# Patient Record
Sex: Male | Born: 1950 | Hispanic: No | Marital: Married
Health system: Southern US, Community
[De-identification: ages and names within clinical notes are randomized; demographics above are authoritative.]

---

## 2015-01-04 ENCOUNTER — Institutional Professional Consult (permissible substitution) (HOSPITAL_COMMUNITY): Payer: Self-pay

## 2015-01-04 ENCOUNTER — Inpatient Hospital Stay
Admission: AD | Admit: 2015-01-04 | Discharge: 2015-02-07 | Disposition: E | Payer: Self-pay | Source: Ambulatory Visit | Attending: Internal Medicine | Admitting: Internal Medicine

## 2015-01-04 DIAGNOSIS — Z4659 Encounter for fitting and adjustment of other gastrointestinal appliance and device: Secondary | ICD-10-CM

## 2015-01-04 DIAGNOSIS — T17408A Unspecified foreign body in trachea causing other injury, initial encounter: Secondary | ICD-10-CM

## 2015-01-05 DIAGNOSIS — J810 Acute pulmonary edema: Secondary | ICD-10-CM

## 2015-01-05 DIAGNOSIS — J90 Pleural effusion, not elsewhere classified: Secondary | ICD-10-CM

## 2015-01-05 DIAGNOSIS — J9601 Acute respiratory failure with hypoxia: Secondary | ICD-10-CM

## 2015-01-05 DIAGNOSIS — J189 Pneumonia, unspecified organism: Secondary | ICD-10-CM

## 2015-01-05 LAB — COMPREHENSIVE METABOLIC PANEL
ALT: 33 U/L (ref 0–53)
ANION GAP: 10 (ref 5–15)
AST: 26 U/L (ref 0–37)
Albumin: 2.6 g/dL — ABNORMAL LOW (ref 3.5–5.2)
Alkaline Phosphatase: 56 U/L (ref 39–117)
BUN: 39 mg/dL — ABNORMAL HIGH (ref 6–23)
CO2: 24 mmol/L (ref 19–32)
Calcium: 8.6 mg/dL (ref 8.4–10.5)
Chloride: 112 mmol/L (ref 96–112)
Creatinine, Ser: 1.93 mg/dL — ABNORMAL HIGH (ref 0.50–1.35)
GFR calc Af Amer: 40 mL/min — ABNORMAL LOW (ref >90)
GFR calc non Af Amer: 35 mL/min — ABNORMAL LOW (ref >90)
GLUCOSE: 107 mg/dL — AB (ref 70–99)
POTASSIUM: 3.6 mmol/L (ref 3.5–5.1)
Sodium: 146 mmol/L — ABNORMAL HIGH (ref 135–145)
TOTAL PROTEIN: 5.6 g/dL — AB (ref 6.0–8.3)
Total Bilirubin: 0.8 mg/dL (ref 0.3–1.2)

## 2015-01-05 LAB — CBC WITH DIFFERENTIAL/PLATELET
Basophils Absolute: 0 10*3/uL (ref 0.0–0.1)
Basophils Relative: 0 % (ref 0–1)
EOS ABS: 0 10*3/uL (ref 0.0–0.7)
Eosinophils Relative: 0 % (ref 0–5)
HEMATOCRIT: 35 % — AB (ref 39.0–52.0)
Hemoglobin: 10.7 g/dL — ABNORMAL LOW (ref 13.0–17.0)
Lymphocytes Relative: 2 % — ABNORMAL LOW (ref 12–46)
Lymphs Abs: 0.3 10*3/uL — ABNORMAL LOW (ref 0.7–4.0)
MCH: 25.7 pg — ABNORMAL LOW (ref 26.0–34.0)
MCHC: 30.6 g/dL (ref 30.0–36.0)
MCV: 84.1 fL (ref 78.0–100.0)
MONO ABS: 1.4 10*3/uL — AB (ref 0.1–1.0)
MONOS PCT: 11 % (ref 3–12)
Neutro Abs: 11.6 10*3/uL — ABNORMAL HIGH (ref 1.7–7.7)
Neutrophils Relative %: 87 % — ABNORMAL HIGH (ref 43–77)
PLATELETS: 265 10*3/uL (ref 150–400)
RBC: 4.16 MIL/uL — ABNORMAL LOW (ref 4.22–5.81)
RDW: 18.7 % — ABNORMAL HIGH (ref 11.5–15.5)
WBC: 13.2 10*3/uL — AB (ref 4.0–10.5)

## 2015-01-05 LAB — VITAMIN B12: Vitamin B-12: 1206 pg/mL — ABNORMAL HIGH (ref 211–911)

## 2015-01-05 LAB — TSH: TSH: 12.64 u[IU]/mL — ABNORMAL HIGH (ref 0.350–4.500)

## 2015-01-05 LAB — PHOSPHORUS: PHOSPHORUS: 4.6 mg/dL (ref 2.3–4.6)

## 2015-01-05 LAB — LACTIC ACID, PLASMA: LACTIC ACID, VENOUS: 4 mmol/L — AB (ref 0.5–2.0)

## 2015-01-05 LAB — URIC ACID: URIC ACID, SERUM: 9.5 mg/dL — AB (ref 4.0–7.8)

## 2015-01-05 LAB — MAGNESIUM: Magnesium: 2.3 mg/dL (ref 1.5–2.5)

## 2015-01-05 LAB — VANCOMYCIN, TROUGH: VANCOMYCIN TR: 27.4 ug/mL — AB (ref 10.0–20.0)

## 2015-01-05 LAB — APTT: APTT: 30 s (ref 24–37)

## 2015-01-05 LAB — PROCALCITONIN: PROCALCITONIN: 0.15 ng/mL

## 2015-01-05 NOTE — Consult Note (Signed)
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 Transferred to [facility] from [place] on 4/28 for ventilator weaning after a prolonged hospital course. Initially admitted w/ working dx of CAP and decompensated HF (EF 25-30%). Course was c/b VT (treated w/ amio), acute on chronic renal failure (likey driven by diuretics w/ BL src ~1.7), acute encephalopathy and septic shock. Ultimately transferred to [facility] for weaning efforts.   SIGNIFICANT EVENTS  Cardiac cath at [facility]: neg for sig CAD, elevated left ventricular diastolic pressure (31 mmHg) Seen by nephrology for ARF at [facility] Seen by rheum for sarcoid at [facility] started on pred.   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   Prior to Admission medications   Reviewed    Allergies not on file No known drug allergies   FAMILY HISTORY:  + DM, heart failure HTN SOCIAL HISTORY: Retired, heavy smoker, lived alone. Drank beer socially   REVIEW OF SYSTEMS:   Unable   SUBJECTIVE:  Unable  VITAL SIGNS:  reviewed. AF w/ RVR on tele Normotensive   PHYSICAL EXAMINATION: General:  Currently lying in the bed. Agitated but not focal  Neuro:  Awake, agitated, moves all ext. Does not f/c  HEENT:  Orally intubated, no JVd  Cardiovascular:  Tachy irreg  Lungs:  Diffuse rales/course rhonchi  Abdomen:  Soft + bowel sounds Musculoskeletal:  Intact  Skin:  + LE edema 3+   Recent Labs Lab 01/05/15 0905  NA 146*  K 3.6  CL 112  CO2 24  BUN 39*  CREATININE  1.93*  GLUCOSE 107*    Recent Labs Lab 01/05/15 0905  HGB 10.7*  HCT 35.0*  WBC 13.2*  PLT 265   Dg Chest Port 1 View  Jan 11, 2015   CLINICAL DATA:  64 year old male status post intubation  EXAM: PORTABLE CHEST - 1 VIEW  COMPARISON:  Concurrently obtained radiographs of the abdomen  FINDINGS: Endotracheal tube tip is 4.6 cm above the carina. A nasogastric tube is present. The tip lies below the field of view likely within the stomach. Right upper extremity PICC. Catheter tip projects over the superior cavoatrial junction. Cardiomegaly. Bilateral layering pleural effusions with associated bibasilar opacities (asymmetric and greater on the right than the left). Pulmonary vascular congestion and mild interstitial edema.  IMPRESSION: 1. The tip of the endotracheal tube is 4.6 cm above the carina. 2. The tip of the right upper extremity PICC projects over the superior cavoatrial junction. 3. Marked enlargement of the cardiopericardial silhouette which may reflect cardiomegaly and/or pericardial effusion. 4. Bilateral layering pleural effusions. 5. Bibasilar opacities more conspicuous on the right than the left. Opacities may reflect a combination of pleural fluid with atelectasis. However, superimposed infiltrate is difficult to exclude particularly in the right lower lobe.   Electronically Signed   By: Malachy Moan M.D.   On: 01-11-2015 20:13   Dg Abd Portable 1v  01-11-15   CLINICAL DATA:  NG tube placement  EXAM: PORTABLE ABDOMEN - 1  VIEW  COMPARISON:  None.  FINDINGS: Nasogastric tube with tip in the gastric body. Left basilar atelectasis. Normal bowel-gas pattern.  IMPRESSION: NG tube in stomach.   Electronically Signed   By: Genevive BiStewart  Edmunds M.D.   On: 12/13/2014 20:13    ASSESSMENT / PLAN: Acute Hypoxic respiratory failure in setting of bilateral pulmonary infiltrates. Suspect that this is a mix of pulmonary edema w/ bilateral effusions super-imposed on possible HCAP. He is on minimal  vent support but still has what looks like significant edema and effusion on film from 4/28. Currently this and his residual encephalopathy are his major barriers to extubation. Would try to get him off versed and wean the fentanyl to off.  Plan Begin PSV trials D/c versed Start precedex and hope to change fentanyl to PRN instead of via gtt Push diuresis  Complete abx (currently cultures pending). If negative could limit to 8-10 d total rx.  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  Plan Per IM service   Simonne MartinetPeter E Babcock ACNP-BC [facility] Pul[facility]/Critical Care Pager # [phone] OR # [phone] if no answer  Hypoxemic respiratory failure due to PNA and pulmonary edema with bilateral pleural effusion likely related to that as well.  I reviewed CXR myself, infiltrate, edema and effusion noted.  Discussed with NP, will not wean today as crackles are noted in all lung fields and very large amounts of purulent secretions noted in tubing.  Will need to continue abx and aggressive diureses as renal function and BP allows then will follow up.  The patient is critically ill with multiple organ systems failure and requires high complexity decision making for assessment and support, frequent evaluation and titration of therapies, application of advanced monitoring technologies and extensive interpretation of multiple databases.   Critical Care Time devoted to patient care services described in this note is  35  Minutes. This time reflects time of care of this signee Dr Koren BoundWesam Yacoub. This critical care time does not reflect procedure time, or teaching time or supervisory time of PA/NP/Med student/Med Resident etc but could involve care discussion time.  Alyson ReedyWesam G. Yacoub, M.D. [facility]/Critical Care Medicine. Pager: [phone]. After hours pager:  [phone].  01/05/2015, 10:57 AM

## 2015-01-06 LAB — HEMOGLOBIN A1C
HEMOGLOBIN A1C: 7.1 % — AB (ref 4.8–5.6)
Mean Plasma Glucose: 157 mg/dL

## 2015-01-07 LAB — CLOSTRIDIUM DIFFICILE BY PCR: Toxigenic C. Difficile by PCR: NEGATIVE

## 2015-01-07 DEATH — deceased

## 2015-01-08 ENCOUNTER — Other Ambulatory Visit: Payer: Self-pay

## 2015-01-08 LAB — BASIC METABOLIC PANEL
ANION GAP: 10 (ref 5–15)
BUN: 59 mg/dL — ABNORMAL HIGH (ref 6–20)
CHLORIDE: 120 mmol/L — AB (ref 101–111)
CO2: 25 mmol/L (ref 22–32)
CREATININE: 2.36 mg/dL — AB (ref 0.61–1.24)
Calcium: 6.6 mg/dL — ABNORMAL LOW (ref 8.9–10.3)
GFR calc Af Amer: 32 mL/min — ABNORMAL LOW (ref >60)
GFR, EST NON AFRICAN AMERICAN: 27 mL/min — AB (ref >60)
Glucose, Bld: 238 mg/dL — ABNORMAL HIGH (ref 70–99)
Potassium: 2 mmol/L — CL (ref 3.5–5.1)
Sodium: 155 mmol/L — ABNORMAL HIGH (ref 135–145)

## 2015-01-08 LAB — MAGNESIUM: Magnesium: 2 mg/dL (ref 1.7–2.4)

## 2015-01-08 NOTE — Consult Note (Signed)
Duplicate

## 2015-01-09 DIAGNOSIS — Z515 Encounter for palliative care: Secondary | ICD-10-CM

## 2015-01-09 NOTE — Consult Note (Addendum)
   Name: Derrick Fuller MRN: [id number] DOB: Jan 29, 1951    ADMISSION DATE:  October 05, 2014 CONSULTATION DATE:  4/29  REFERRING MD :  Sharyon MedicusHijazi   CHIEF COMPLAINT:  Vent weaning   BRIEF PATIENT DESCRIPTION:  64 year old african american male w/ sig PMH: class IV HF, systolic CM w/ EF 25-30%, COPD and HTN, chronic pain, bipolar disease and possible sarcoid. [facility] [place] [facility] [place] for ventilator weaning after a prolonged hospital course. Initially admitted w/ working dx of CAP and decompensated HF (EF 25-30%). Course was c/b VT (treated w/ amio), acute on chronic renal failure (likey driven by diuretics w/ BL src ~1.7), acute encephalopathy and septic shock. [facility].   SIGNIFICANT EVENTS  [facility]: neg for sig CAD, elevated left ventricular diastolic pressure (31 mmHg) [facility] pred.   STUDIES:   SUBJECTIVE:  Extubated to comfort  VITAL SIGNS:  reviewed. AF w/ RVR on tele Normotensive   PHYSICAL EXAMINATION: General:  Lying in bed, agonal, no signs of distress. Neuro:  Unresponsive, does not withdraw to pain HEENT:  Mask for O2 as a comfort measure only. Cardiovascular:  Tachy irreg  Lungs: Diffuse upper airway sounds. Abdomen:  Soft + bowel sounds Musculoskeletal:  Intact  Skin:  + LE edema 3+  Recent Labs Lab 01/05/15 0905 01/08/15 1020  NA 146* 155*  K 3.6 2.0*  CL 112 120*  CO2 24 25  BUN 39* 59*  CREATININE 1.93* 2.36*  GLUCOSE 107* 238*    Recent Labs Lab 01/05/15 0905  HGB 10.7*  HCT 35.0*  WBC 13.2*  PLT 265   No results found.  ASSESSMENT / PLAN: Acute Hypoxic respiratory failure in setting of bilateral pulmonary infiltrates. Suspect that this is a mix of pulmonary edema w/ bilateral effusions super-imposed on possible HCAP. He is on minimal vent support but still has what looks like significant edema and effusion  on film from 4/28. Currently this and his residual encephalopathy are his major barriers to extubation. Would try to get him off versed and wean the fentanyl to off.   Plan Extubated to comfort. Continue morphine drip and titrate for comfort. Supplemental O2 only for comfort care purposes. PCCM will sign off, please call back if needed.  Alyson ReedyWesam G. Yacoub, M.D. [facility] Medicine. Pager: [phone]. After hours pager: [phone].  01/09/2015, 12:06 PM

## 2015-01-11 LAB — CULTURE, BLOOD (ROUTINE X 2)
Culture: NO GROWTH
Culture: NO GROWTH

## 2015-02-07 DEATH — deceased

## 2016-04-15 IMAGING — CR DG CHEST 1V PORT
1 series · 1 of 1 positions shown · non-contrast
Comparison: Concurrently obtained radiographs of the abdomen

CLINICAL DATA: 63-year-old male status post intubation

EXAM:
PORTABLE CHEST - 1 VIEW

[AP]
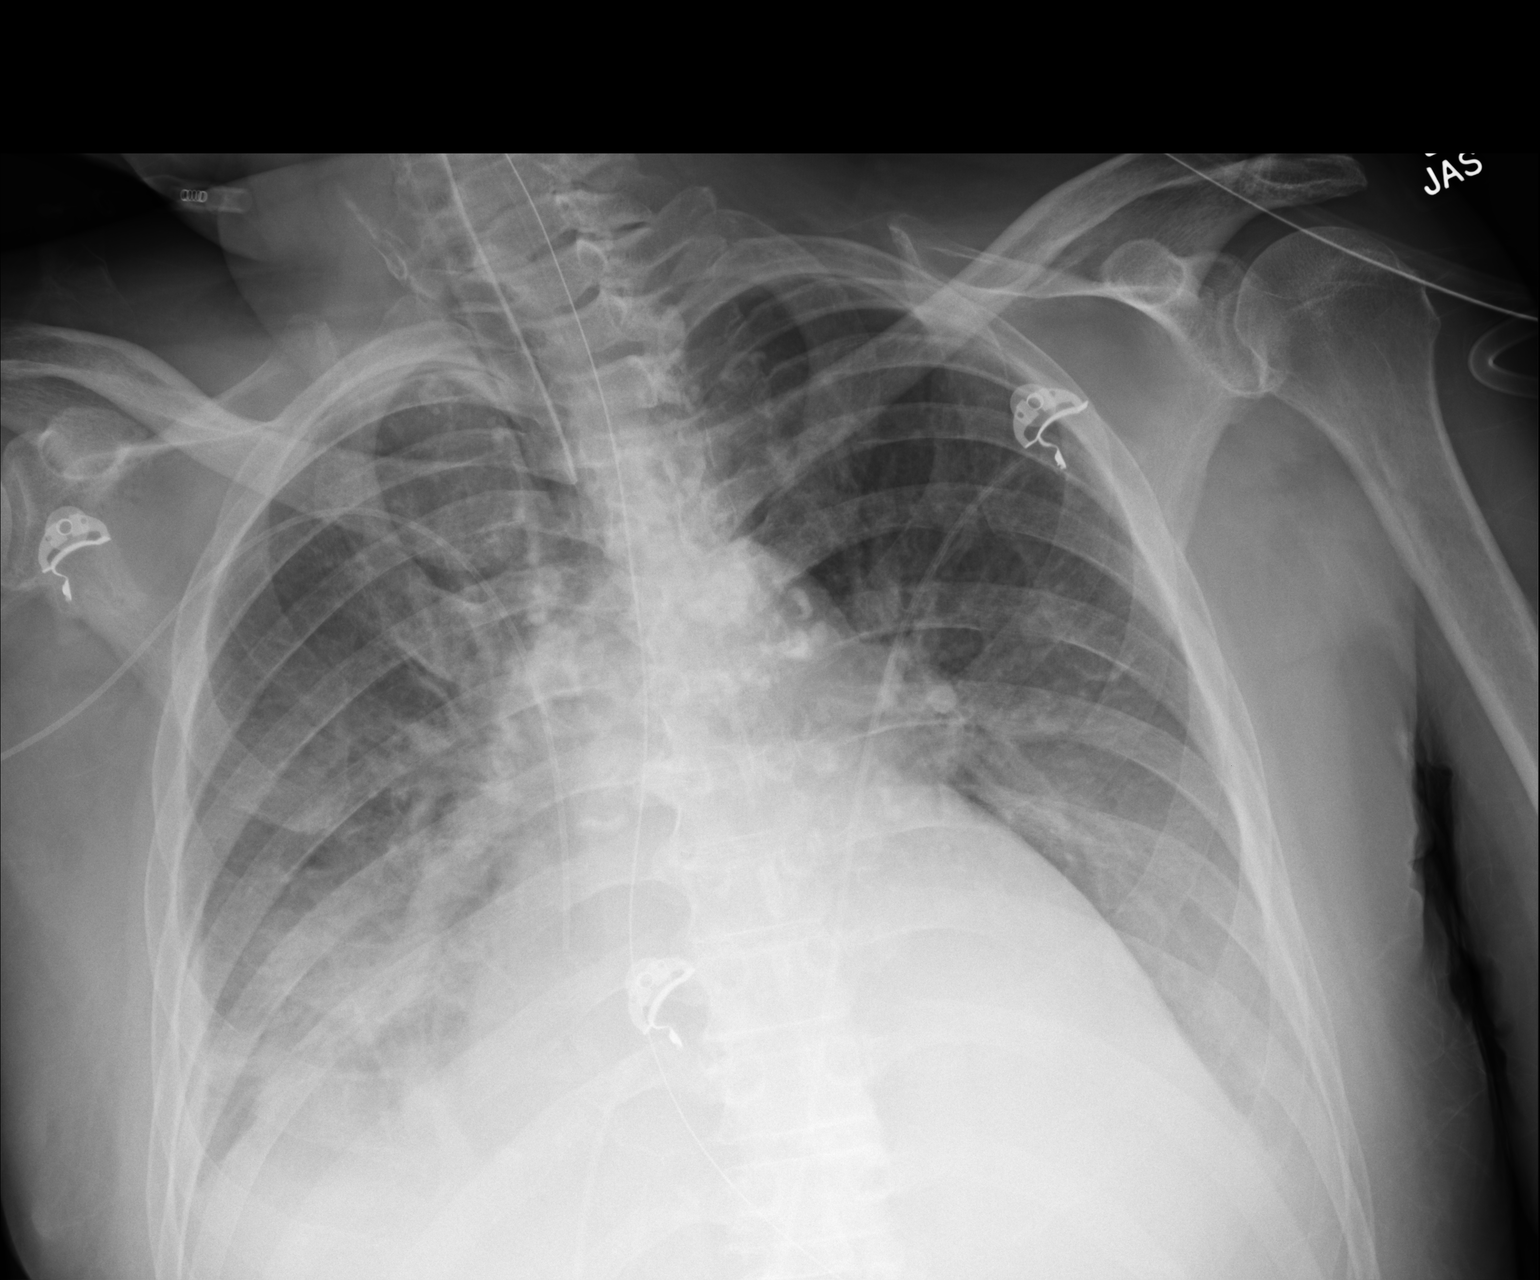

[1 of 1 positions shown; findings below may reference images not displayed]

FINDINGS: Endotracheal tube tip is 4.6 cm above the carina. A nasogastric tube
is present. The tip lies below the field of view likely within the
stomach. Right upper extremity PICC. Catheter tip projects over the
superior cavoatrial junction. Cardiomegaly. Bilateral layering
pleural effusions with associated bibasilar opacities (asymmetric
and greater on the right than the left). Pulmonary vascular
congestion and mild interstitial edema.
IMPRESSION: 1. The tip of the endotracheal tube is 4.6 cm above the carina.
2. The tip of the right upper extremity PICC projects over the
superior cavoatrial junction.
3. Marked enlargement of the cardiopericardial silhouette which may
reflect cardiomegaly and/or pericardial effusion.
4. Bilateral layering pleural effusions.
5. Bibasilar opacities more conspicuous on the right than the left.
Opacities may reflect a combination of pleural fluid with
atelectasis. However, superimposed infiltrate is difficult to
exclude particularly in the right lower lobe.

## 2016-04-15 IMAGING — CR DG ABD PORTABLE 1V
1 series · 1 of 1 positions shown · non-contrast
Comparison: None.

CLINICAL DATA: NG tube placement

EXAM:
PORTABLE ABDOMEN - 1 VIEW

[AP]
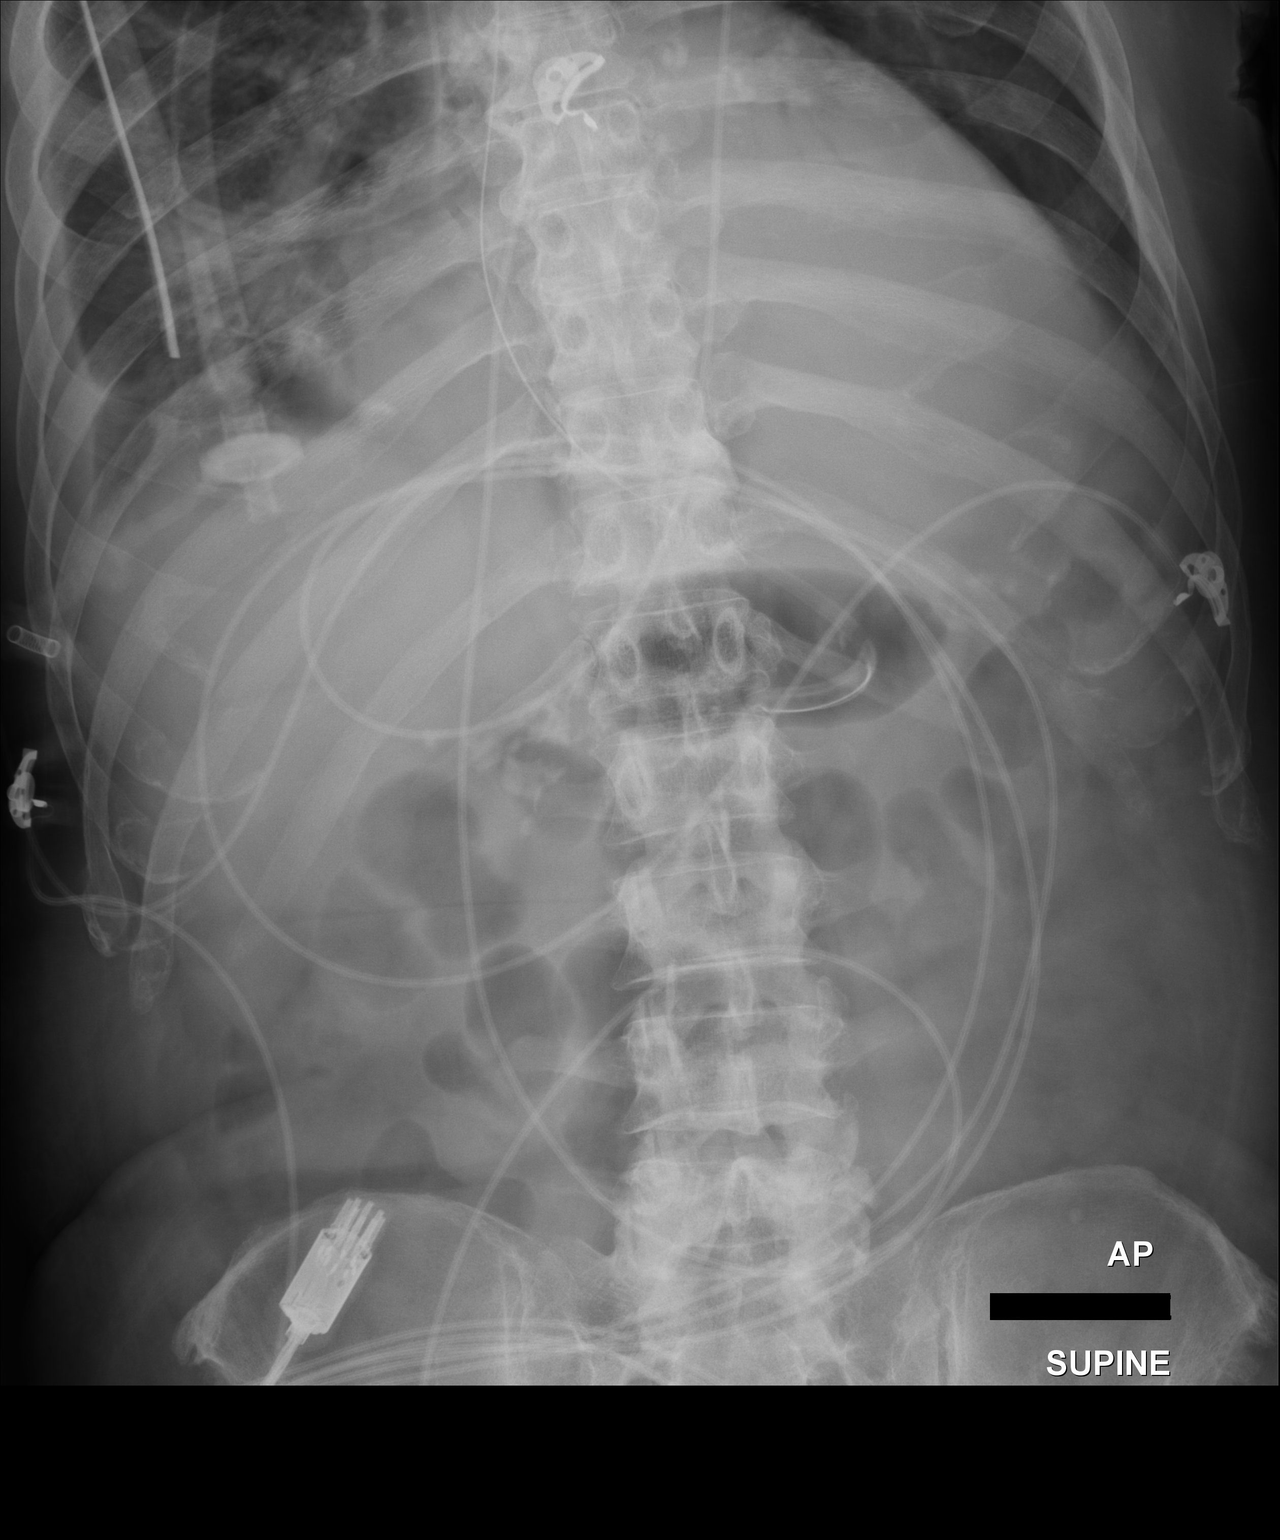

[1 of 1 positions shown; findings below may reference images not displayed]

FINDINGS: Nasogastric tube with tip in the gastric body. Left basilar
atelectasis. Normal bowel-gas pattern.
IMPRESSION: NG tube in stomach.
